# Patient Record
Sex: Male | Born: 1975 | Race: White | Hispanic: No | Marital: Married | Smoking: Current every day smoker
Health system: Southern US, Community
[De-identification: ages and names within clinical notes are randomized; demographics above are authoritative.]

## PROBLEM LIST (undated history)

## (undated) HISTORY — PX: KNEE ARTHROSCOPY: SUR90

---

## 2006-05-24 ENCOUNTER — Ambulatory Visit (HOSPITAL_COMMUNITY): Admission: RE | Admit: 2006-05-24 | Discharge: 2006-05-24 | Payer: Self-pay | Admitting: Family Medicine

## 2006-05-24 ENCOUNTER — Ambulatory Visit: Payer: Self-pay | Admitting: Family Medicine

## 2006-05-27 ENCOUNTER — Ambulatory Visit: Payer: Self-pay | Admitting: *Deleted

## 2006-07-03 ENCOUNTER — Ambulatory Visit: Payer: Self-pay | Admitting: Family Medicine

## 2006-07-22 ENCOUNTER — Ambulatory Visit: Payer: Self-pay | Admitting: Family Medicine

## 2006-08-09 ENCOUNTER — Emergency Department (HOSPITAL_COMMUNITY): Admission: EM | Admit: 2006-08-09 | Discharge: 2006-08-09 | Payer: Self-pay | Admitting: Emergency Medicine

## 2006-10-25 ENCOUNTER — Emergency Department (HOSPITAL_COMMUNITY): Admission: EM | Admit: 2006-10-25 | Discharge: 2006-10-25 | Payer: Self-pay | Admitting: Emergency Medicine

## 2006-11-22 ENCOUNTER — Ambulatory Visit: Payer: Self-pay | Admitting: Family Medicine

## 2006-11-22 LAB — CONVERTED CEMR LAB
Cholesterol: 168 mg/dL (ref 0–200)
Hep B Core Total Ab: NEGATIVE
Hep B S Ab: NEGATIVE
T4, Total: 5.7 ug/dL (ref 5.0–12.5)
Triglycerides: 80 mg/dL (ref <150)
VLDL: 16 mg/dL (ref 0–40)

## 2006-11-25 ENCOUNTER — Ambulatory Visit (HOSPITAL_COMMUNITY): Admission: RE | Admit: 2006-11-25 | Discharge: 2006-11-25 | Payer: Self-pay | Admitting: Family Medicine

## 2006-12-26 ENCOUNTER — Emergency Department (HOSPITAL_COMMUNITY): Admission: EM | Admit: 2006-12-26 | Discharge: 2006-12-26 | Payer: Self-pay | Admitting: Emergency Medicine

## 2007-03-04 ENCOUNTER — Ambulatory Visit: Payer: Self-pay | Admitting: Family Medicine

## 2007-04-09 ENCOUNTER — Emergency Department (HOSPITAL_COMMUNITY): Admission: EM | Admit: 2007-04-09 | Discharge: 2007-04-09 | Payer: Self-pay | Admitting: Emergency Medicine

## 2007-06-21 ENCOUNTER — Emergency Department (HOSPITAL_COMMUNITY): Admission: EM | Admit: 2007-06-21 | Discharge: 2007-06-21 | Payer: Self-pay | Admitting: Emergency Medicine

## 2007-08-18 ENCOUNTER — Emergency Department (HOSPITAL_COMMUNITY): Admission: EM | Admit: 2007-08-18 | Discharge: 2007-08-18 | Payer: Self-pay | Admitting: Emergency Medicine

## 2007-11-24 ENCOUNTER — Emergency Department (HOSPITAL_COMMUNITY): Admission: EM | Admit: 2007-11-24 | Discharge: 2007-11-24 | Payer: Self-pay | Admitting: Emergency Medicine

## 2008-02-15 ENCOUNTER — Emergency Department (HOSPITAL_COMMUNITY): Admission: EM | Admit: 2008-02-15 | Discharge: 2008-02-15 | Payer: Self-pay | Admitting: Emergency Medicine

## 2008-06-01 ENCOUNTER — Emergency Department (HOSPITAL_COMMUNITY): Admission: EM | Admit: 2008-06-01 | Discharge: 2008-06-01 | Payer: Self-pay | Admitting: Emergency Medicine

## 2008-06-19 ENCOUNTER — Encounter: Admission: RE | Admit: 2008-06-19 | Discharge: 2008-06-19 | Payer: Self-pay | Admitting: Neurological Surgery

## 2008-07-12 ENCOUNTER — Emergency Department (HOSPITAL_COMMUNITY): Admission: EM | Admit: 2008-07-12 | Discharge: 2008-07-12 | Payer: Self-pay | Admitting: Emergency Medicine

## 2008-07-16 ENCOUNTER — Emergency Department (HOSPITAL_COMMUNITY): Admission: EM | Admit: 2008-07-16 | Discharge: 2008-07-16 | Payer: Self-pay | Admitting: Emergency Medicine

## 2008-10-06 ENCOUNTER — Emergency Department (HOSPITAL_COMMUNITY): Admission: EM | Admit: 2008-10-06 | Discharge: 2008-10-06 | Payer: Self-pay | Admitting: Emergency Medicine

## 2010-05-12 LAB — CBC
MCHC: 34.6 g/dL (ref 30.0–36.0)
MCV: 94.8 fL (ref 78.0–100.0)
WBC: 17.4 10*3/uL — ABNORMAL HIGH (ref 4.0–10.5)

## 2010-05-12 LAB — DIFFERENTIAL
Basophils Relative: 0 % (ref 0–1)
Eosinophils Absolute: 0.1 10*3/uL (ref 0.0–0.7)
Eosinophils Relative: 1 % (ref 0–5)

## 2010-05-12 LAB — BASIC METABOLIC PANEL
Calcium: 8.7 mg/dL (ref 8.4–10.5)
Creatinine, Ser: 0.71 mg/dL (ref 0.4–1.5)
GFR calc Af Amer: >60 mL/min (ref >60)
GFR calc non Af Amer: >60 mL/min (ref >60)
Potassium: 4.4 mEq/L (ref 3.5–5.1)

## 2010-05-12 LAB — D-DIMER, QUANTITATIVE: D-Dimer, Quant: 0.34 ug/mL-FEU (ref 0.00–0.48)

## 2016-09-04 ENCOUNTER — Encounter (HOSPITAL_BASED_OUTPATIENT_CLINIC_OR_DEPARTMENT_OTHER): Payer: Self-pay | Admitting: Emergency Medicine

## 2016-09-04 ENCOUNTER — Emergency Department (HOSPITAL_BASED_OUTPATIENT_CLINIC_OR_DEPARTMENT_OTHER): Payer: Self-pay

## 2016-09-04 ENCOUNTER — Emergency Department (HOSPITAL_BASED_OUTPATIENT_CLINIC_OR_DEPARTMENT_OTHER)
Admission: EM | Admit: 2016-09-04 | Discharge: 2016-09-04 | Disposition: A | Discharge status: Home / Self Care | Payer: Self-pay | Attending: Emergency Medicine | Admitting: Emergency Medicine

## 2016-09-04 DIAGNOSIS — S61309A Unspecified open wound of unspecified finger with damage to nail, initial encounter: Secondary | ICD-10-CM

## 2016-09-04 DIAGNOSIS — Y999 Unspecified external cause status: Secondary | ICD-10-CM | POA: Insufficient documentation

## 2016-09-04 DIAGNOSIS — S6991XA Unspecified injury of right wrist, hand and finger(s), initial encounter: Secondary | ICD-10-CM

## 2016-09-04 DIAGNOSIS — Z23 Encounter for immunization: Secondary | ICD-10-CM | POA: Insufficient documentation

## 2016-09-04 DIAGNOSIS — F17210 Nicotine dependence, cigarettes, uncomplicated: Secondary | ICD-10-CM | POA: Insufficient documentation

## 2016-09-04 DIAGNOSIS — S61312A Laceration without foreign body of right middle finger with damage to nail, initial encounter: Secondary | ICD-10-CM | POA: Insufficient documentation

## 2016-09-04 DIAGNOSIS — Y9289 Other specified places as the place of occurrence of the external cause: Secondary | ICD-10-CM | POA: Insufficient documentation

## 2016-09-04 DIAGNOSIS — W230XXA Caught, crushed, jammed, or pinched between moving objects, initial encounter: Secondary | ICD-10-CM | POA: Insufficient documentation

## 2016-09-04 DIAGNOSIS — Y9389 Activity, other specified: Secondary | ICD-10-CM | POA: Insufficient documentation

## 2016-09-04 MED ORDER — IBUPROFEN 800 MG PO TABS
800.0000 mg | ORAL_TABLET | Freq: Once | ORAL | Status: AC
  Administered 2016-09-04: 800 mg via ORAL
  Filled 2016-09-04: qty 1

## 2016-09-04 MED ORDER — TETANUS-DIPHTH-ACELL PERTUSSIS 5-2.5-18.5 LF-MCG/0.5 IM SUSP
0.5000 mL | Freq: Once | INTRAMUSCULAR | Status: AC
  Administered 2016-09-04: 0.5 mL via INTRAMUSCULAR
  Filled 2016-09-04: qty 0.5
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
## 2016-09-04 NOTE — ED Triage Notes (Signed)
Patient states that he has his right middle finger shut in a door

## 2016-09-04 NOTE — ED Notes (Signed)
Pt not in room when rn went to dc pt

## 2016-09-04 NOTE — Discharge Instructions (Signed)
Alternate 600 mg of ibuprofen and [phone] mg of Tylenol every 3 hours as needed for pain. Do not exceed 4000 mg of Tylenol daily. Change dressings daily and apply antibiotic ointment to the nail twice daily for. Apply the splint as needed for comfort. Follow-up with hand surgery or your primary care doctor for reevaluation if symptoms persist. Return to the ED immediately if any concerning signs or symptoms develop such as redness, swelling, abnormal drainage, or fevers.

## 2016-12-06 ENCOUNTER — Encounter (HOSPITAL_COMMUNITY): Payer: Self-pay

## 2016-12-06 ENCOUNTER — Emergency Department (HOSPITAL_COMMUNITY)
Admission: EM | Admit: 2016-12-06 | Discharge: 2016-12-06 | Disposition: A | Discharge status: Home / Self Care | Payer: Self-pay | Attending: Emergency Medicine | Admitting: Emergency Medicine

## 2016-12-06 DIAGNOSIS — F17210 Nicotine dependence, cigarettes, uncomplicated: Secondary | ICD-10-CM | POA: Insufficient documentation

## 2016-12-06 DIAGNOSIS — K047 Periapical abscess without sinus: Secondary | ICD-10-CM | POA: Insufficient documentation

## 2016-12-06 MED ORDER — OXYCODONE-ACETAMINOPHEN 5-325 MG PO TABS: 1.0000 | ORAL_TABLET | ORAL | 0 refills | Status: DC | PRN

## 2016-12-06 MED ORDER — BUPIVACAINE-EPINEPHRINE (PF) 0.5% -1:200000 IJ SOLN
1.8000 mL | Freq: Once | INTRAMUSCULAR | Status: AC
  Administered 2016-12-06: 1.8 mL
  Filled 2016-12-06: qty 1.8

## 2016-12-06 MED ORDER — OXYCODONE-ACETAMINOPHEN 5-325 MG PO TABS
1.0000 | ORAL_TABLET | Freq: Once | ORAL | Status: AC
  Administered 2016-12-06: 1 via ORAL
  Filled 2016-12-06: qty 1

## 2016-12-06 MED ORDER — OXYCODONE-ACETAMINOPHEN 5-325 MG PO TABS: 1.0000 | ORAL_TABLET | Freq: Three times a day (TID) | ORAL | 0 refills | Status: AC | PRN

## 2016-12-06 MED ORDER — AMOXICILLIN-POT CLAVULANATE 875-125 MG PO TABS: 1.0000 | ORAL_TABLET | Freq: Two times a day (BID) | ORAL | 0 refills | Status: DC

## 2016-12-06 MED ORDER — AMOXICILLIN-POT CLAVULANATE 875-125 MG PO TABS: 1.0000 | ORAL_TABLET | Freq: Two times a day (BID) | ORAL | 0 refills | Status: AC

## 2016-12-06 NOTE — ED Provider Notes (Signed)
[facility] EMERGENCY DEPARTMENT Provider Note   CSN: [id number] Arrival date & time: 12/06/16  1138    History   Chief Complaint Chief Complaint  Patient presents with  . Dental Pain    HPI Arthur Warren is a 41 y.o. male.  HPI   41 year old male presents today with complaints of dental pain.  Patient reports a 3-day history of right-sided lower dental swelling and pain.  He notes a history of the same which has ruptured on its own.  He denies any extension down into the neck, he denies any fever, nausea, vomiting.  Patient denies any other complaints today.  Patient reports he does not have a dentist.  History reviewed. No pertinent past medical history.  There are no active problems to display for this patient.   Past Surgical History:  Procedure Laterality Date  . KNEE ARTHROSCOPY         Home Medications    Prior to Admission medications   Medication Sig Start Date End Date Taking? Authorizing Provider  amoxicillin-clavulanate (AUGMENTIN) 875-125 MG tablet Take 1 tablet by mouth every 12 (twelve) hours. 12/06/16   Faelyn Sigler, Tinnie Gens, PA-C  oxyCODONE-acetaminophen (PERCOCET/ROXICET) 5-325 MG tablet Take 1-2 tablets by mouth every 4 (four) hours as needed for severe pain. 12/06/16   Eyvonne Mechanic, PA-C    Family History No family history on file.  Social History Social History  Substance Use Topics  . Smoking status: Current Every Day Smoker  . Smokeless tobacco: Never Used  . Alcohol use Yes     Comment: rarely     Allergies   Patient has no known allergies.   Review of Systems Review of Systems  All other systems reviewed and are negative.    Physical Exam Updated Vital Signs BP (!) 131/106 (BP Location: Right Arm)   Pulse 82   Temp 98.1 F (36.7 C) (Oral)   Resp 16   Ht 5\' 11"  (1.803 m)   Wt 79.4 kg (175 lb)   SpO2 98%   BMI 24.41 kg/m   Physical Exam  Constitutional: He is oriented to person, place, and time. He  appears well-developed and well-nourished.  HENT:  Head: Normocephalic and atraumatic.   Right-sided dental swelling, induration along the right mandible, no fluctuance, floor the mouth is soft, no spread of infection, neck is supple full active range of motion  Eyes: Pupils are equal, round, and reactive to light. Conjunctivae are normal. Right eye exhibits no discharge. Left eye exhibits no discharge. No scleral icterus.  Neck: Normal range of motion. No JVD present. No tracheal deviation present.  Pulmonary/Chest: Effort normal. No stridor.  Neurological: He is alert and oriented to person, place, and time. Coordination normal.  Psychiatric: He has a normal mood and affect. His behavior is normal. Judgment and thought content normal.  Nursing note and vitals reviewed.    ED Treatments / Results  Labs (all labs ordered are listed, but only abnormal results are displayed) Labs Reviewed - No data to display  EKG  EKG Interpretation None       Radiology No results found.  Procedures Procedures (including critical care time)  NERVE BLOCK Performed by: Thermon Leyland Consent: Verbal consent obtained. Required items: required blood products, implants, devices, and special equipment available Time out: Immediately prior to procedure a "time out" was called to verify the correct patient, procedure, equipment, support staff and site/side marked as required.  Indication: Dental infection Nerve block body site: Mandible  Preparation: Patient  was prepped and draped in the usual sterile fashion. Needle gauge: 24 G Location technique: anatomical landmarks  Local anesthetic: Bupivacaine with epinephrine  Anesthetic total: 1.8 mL's ml  Outcome: pain improved Patient tolerance: Patient tolerated the procedure well with no immediate complications.  Medications Ordered in ED Medications  bupivacaine-epinephrine (MARCAINE W/ EPI) 0.5% -1:200000 injection 1.8 mL (1.8 mLs  Infiltration Given 12/06/16 1321)  oxyCODONE-acetaminophen (PERCOCET/ROXICET) 5-325 MG per tablet 1 tablet (1 tablet Oral Given 12/06/16 1321)     Initial Impression / Assessment and Plan / ED Course  I have reviewed the triage vital signs and the nursing notes.  Pertinent labs & imaging results that were available during my care of the patient were reviewed by me and considered in my medical decision making (see chart for details).      Final Clinical Impressions(s) / ED Diagnoses   Final diagnoses:  Dental abscess    Labs:   Imaging:  Consults:  Therapeutics: Bupivacaine, Percocet  Discharge Meds: Percocet, Augmentin  Assessment/Plan: 41 year old male presents today with abscess.  Patient has a small abscess, no signs of systemic illness.  No signs of deep space infection.  He will be treated with antibiotics.  We have an on-call oral surgeon and dentist.  Patient will make contact with them and arrange appropriate follow-up.  He is given strict return precautions, he verbalized understanding and agreement to today's plan had no further questions or concerns at the time discharge.      New Prescriptions New Prescriptions   AMOXICILLIN-CLAVULANATE (AUGMENTIN) 875-125 MG TABLET    Take 1 tablet by mouth every 12 (twelve) hours.   OXYCODONE-ACETAMINOPHEN (PERCOCET/ROXICET) 5-325 MG TABLET    Take 1-2 tablets by mouth every 4 (four) hours as needed for severe pain.     Eyvonne MechanicHedges, Melania Kirks, PA-C 12/06/16 1431    Bethann BerkshireZammit, Joseph, MD 12/06/16 [phone]

## 2016-12-06 NOTE — ED Notes (Signed)
Jeff PA at bedside.  

## 2016-12-06 NOTE — ED Triage Notes (Signed)
PT reports right lower dental abscess x 3 days. Denies fever/chills. Reports he is unable to see dentist at this time d/t insurance

## 2016-12-06 NOTE — Discharge Instructions (Signed)
Please read attached information. If you experience any new or worsening signs or symptoms please return to the emergency room for evaluation. Please follow-up with your primary care provider or specialist as discussed. Please use medication prescribed only as directed and discontinue taking if you have any concerning signs or symptoms.   °
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
